# Patient Record
Sex: Female | Born: 2000 | Race: Black or African American | Hispanic: No | Marital: Single | Smoking: Never smoker
Health system: Southern US, Community
[De-identification: ages and names within clinical notes are randomized; demographics above are authoritative.]

---
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
## 2019-01-19 ENCOUNTER — Ambulatory Visit: Payer: Self-pay | Attending: Internal Medicine

## 2019-01-19 DIAGNOSIS — Z20822 Contact with and (suspected) exposure to covid-19: Secondary | ICD-10-CM

## 2019-01-21 LAB — NOVEL CORONAVIRUS, NAA: SARS-CoV-2, NAA: NOT DETECTED
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
## 2019-03-22 ENCOUNTER — Ambulatory Visit: Payer: Self-pay | Attending: Internal Medicine

## 2019-03-22 DIAGNOSIS — Z20822 Contact with and (suspected) exposure to covid-19: Secondary | ICD-10-CM

## 2019-03-23 LAB — NOVEL CORONAVIRUS, NAA: SARS-CoV-2, NAA: NOT DETECTED

## 2019-12-25 ENCOUNTER — Ambulatory Visit: Payer: Self-pay

## 2020-02-21 ENCOUNTER — Other Ambulatory Visit (HOSPITAL_COMMUNITY)
Admission: RE | Admit: 2020-02-21 | Discharge: 2020-02-21 | Disposition: A | Discharge status: Home / Self Care | Payer: Self-pay | Source: Ambulatory Visit | Attending: Student in an Organized Health Care Education/Training Program | Admitting: Student in an Organized Health Care Education/Training Program

## 2020-02-21 ENCOUNTER — Ambulatory Visit (INDEPENDENT_AMBULATORY_CARE_PROVIDER_SITE_OTHER): Payer: Self-pay | Admitting: Internal Medicine

## 2020-02-21 ENCOUNTER — Other Ambulatory Visit: Payer: Self-pay

## 2020-02-21 ENCOUNTER — Encounter: Payer: Self-pay | Admitting: Internal Medicine

## 2020-02-21 VITALS — BP 127/81 | HR 70 | Temp 98.1°F | Ht 70.0 in | Wt 217.0 lb

## 2020-02-21 DIAGNOSIS — Z113 Encounter for screening for infections with a predominantly sexual mode of transmission: Secondary | ICD-10-CM

## 2020-02-21 DIAGNOSIS — F330 Major depressive disorder, recurrent, mild: Secondary | ICD-10-CM

## 2020-02-21 DIAGNOSIS — Z0000 Encounter for general adult medical examination without abnormal findings: Secondary | ICD-10-CM

## 2020-02-21 DIAGNOSIS — G43909 Migraine, unspecified, not intractable, without status migrainosus: Secondary | ICD-10-CM

## 2020-02-21 DIAGNOSIS — A749 Chlamydial infection, unspecified: Secondary | ICD-10-CM

## 2020-02-21 DIAGNOSIS — A5602 Chlamydial vulvovaginitis: Secondary | ICD-10-CM

## 2020-02-21 HISTORY — DX: Migraine, unspecified, not intractable, without status migrainosus: G43.909

## 2020-02-21 NOTE — Assessment & Plan Note (Signed)
Krista Torres endorses a history of previous episodes of depression that she usually waited out until they resolve.  She notes starting around November 2021, she noted her mood started to become depressed and she no longer enjoyed the things she used to as much.  She was struggling with school as finals were going on.  During this time, her dog passed away and she broke up with her boyfriend; she feels these events severely exacerbated her already present depressed state.  She endorses feeling down, not enjoying things she used to such as going out with her friends.  She endorses a lack of appetite, difficulty relaxing and sleeping.  She feels like her sleeping is affected by her being switched from second shift to third shift with her job.  Assessment/plan: PHQ-9 of 18 today.  I discussed with Krista Torres that I am worried that she is suffering from depression and she agrees.  At this time, she is reluctant to start any medication, but is very interested in going to therapy.  -Referral to [facility] -Follow-up in 4 to 6 weeks to follow-up -If worsening in mood or PHQ-9 at that time, consider initiation of SSRIs

## 2020-02-21 NOTE — Patient Instructions (Addendum)
It was nice seeing you today! Thank you for choosing [facility] for your Primary Care.    Today we talked about:   1. STD screening: We will check for Gonorrhea and Chlamydia. We usually also check HIV and Syphilis, but that is a blood test. If you decide you want to do it, call the clinic and we can schedule a lab only visit.   2. Birth Control: Your Nexplanon is good till December 2023.   3. Lets follow up in 4-6 weeks to see how your Depression is.
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
## 2020-02-21 NOTE — Progress Notes (Addendum)
CC: Wellness visit, establish care  HPI:  Krista Torres is a 20 y.o. with a PMHx as listed below who presents to the clinic for wellness visit, establish care.   Yalonda denies any acute complaints today.  She would like to be tested for STDs and have any other age-appropriate screening test that she requires.  Please see the Encounters tab for problem-based Assessment & Plan regarding status of patient's acute and chronic conditions.  Past Medical History:  -Dysmenorrhea -Migraines -Childhood asthma  Past Surgical History:  None  Past Family History:  Maternal grandmother-leukemia Cousin-syncope with emergent cardiac surgery afterwards, is unsure of her diagnosis Grandparents-diabetes  Social History:  -Lives alone with her dog -Denies any tobacco, alcohol or drug use Chief Operating Officer currently on sabbatical, working third shift at [employer]: Review of Systems  Constitutional: Positive for weight loss (Due to lack of appetite). Negative for chills, fever and malaise/fatigue.  Eyes: Negative for blurred vision and double vision.  Respiratory: Negative for cough, shortness of breath and wheezing.   Cardiovascular: Negative for chest pain, palpitations and leg swelling.  Gastrointestinal: Negative for abdominal pain, blood in stool, constipation, diarrhea, heartburn, melena, nausea and vomiting.  Genitourinary: Negative for dysuria, frequency, hematuria and urgency.       Negative for vaginal discharge, vaginal pain  Musculoskeletal: Negative for joint pain and myalgias.  Skin: Negative for itching and rash.  Neurological: Negative for dizziness, focal weakness, weakness and headaches.  Psychiatric/Behavioral: Positive for depression. Negative for hallucinations, substance abuse and suicidal ideas.   Physical Exam:  Vitals:   02/21/20 0855 02/21/20 0942  BP: (!) 145/94 127/81  Pulse: 72 70  Temp: 98.1 F (36.7 C)   TempSrc: Oral    SpO2: 100%   Weight: 217 lb (98.4 kg)   Height: 5\' 10"  (1.778 m)    Physical Exam Vitals and nursing note reviewed.  Constitutional:      General: She is not in acute distress.    Appearance: She is normal weight.  HENT:     Head: Normocephalic and atraumatic.  Eyes:     Conjunctiva/sclera: Conjunctivae normal.     Pupils: Pupils are equal, round, and reactive to light.  Cardiovascular:     Rate and Rhythm: Normal rate and regular rhythm.     Heart sounds: No murmur heard. No gallop.   Pulmonary:     Effort: Pulmonary effort is normal. No respiratory distress.     Breath sounds: No wheezing, rhonchi or rales.  Abdominal:     General: Bowel sounds are normal. There is no distension.     Palpations: Abdomen is soft.     Tenderness: There is no abdominal tenderness. There is no guarding.  Musculoskeletal:        General: No signs of injury.     Right lower leg: No edema.     Left lower leg: No edema.  Skin:    General: Skin is warm and dry.  Neurological:     General: No focal deficit present.     Mental Status: She is alert and oriented to person, place, and time. Mental status is at baseline.  Psychiatric:        Mood and Affect: Mood normal.        Behavior: Behavior normal.        Thought Content: Thought content normal.        Judgment: Judgment normal.    Assessment & Plan:   See Encounters  Tab for problem based charting.  Patient discussed with Dr. Evette Doffing

## 2020-02-22 DIAGNOSIS — A749 Chlamydial infection, unspecified: Secondary | ICD-10-CM | POA: Insufficient documentation

## 2020-02-22 LAB — URINE CYTOLOGY ANCILLARY ONLY
Chlamydia: POSITIVE — AB
Comment: NEGATIVE
Comment: NEGATIVE
Comment: NORMAL
Neisseria Gonorrhea: NEGATIVE
Trichomonas: NEGATIVE

## 2020-02-22 MED ORDER — DOXYCYCLINE HYCLATE 100 MG PO CAPS: 100.0000 mg | ORAL_CAPSULE | Freq: Two times a day (BID) | ORAL | 0 refills | Status: DC

## 2020-02-22 NOTE — Assessment & Plan Note (Signed)
STD screening positive for Chlamydia. Discussed results with patient. She will inform her previous partner.   -Doxycycline 100 mg twice daily for 7 days sent to pharmacy

## 2020-02-22 NOTE — Addendum Note (Signed)
Addended by: Jose Persia on: 02/22/2020 01:01 PM   Modules accepted: Orders

## 2020-02-22 NOTE — Progress Notes (Signed)
Internal Medicine Clinic Attending  Case discussed with Dr. Basaraba  At the time of the visit.  We reviewed the resident's history and exam and pertinent patient test results.  I agree with the assessment, diagnosis, and plan of care documented in the resident's note.  

## 2020-02-27 ENCOUNTER — Encounter: Payer: Self-pay | Admitting: Internal Medicine

## 2020-03-12 ENCOUNTER — Other Ambulatory Visit: Payer: Self-pay

## 2020-03-12 ENCOUNTER — Telehealth: Payer: Self-pay | Admitting: Behavioral Health

## 2020-03-12 ENCOUNTER — Institutional Professional Consult (permissible substitution): Payer: Self-pay | Admitting: Behavioral Health

## 2020-03-12 NOTE — Telephone Encounter (Signed)
Contacted Pt three times to initiate visit. Lft msg for Pt to call & r/s since this appt time was not working.  Dr. Theodis Shove

## 2020-04-10 NOTE — Addendum Note (Signed)
Addended by: Hulan Fray on: 04/10/2020 12:53 PM   Modules accepted: Orders

## 2020-05-22 ENCOUNTER — Encounter: Payer: Self-pay | Admitting: Internal Medicine

## 2020-05-22 NOTE — Progress Notes (Deleted)
   CC: ***  HPI:  [name] is a 20 y.o.   Past Medical History:  Diagnosis Date  . Acanthosis nigricans 09/22/2011  . Migraine 02/21/2020   Review of Systems:  ***  Physical Exam:  There were no vitals filed for this visit. ***  Assessment & Plan:   See Encounters Tab for problem based charting.  Patient {GC/GE:[id number]::"discussed with","seen with"} Dr. {NAMES:[id number]::"Butcher","Guilloud","Hoffman","Mullen","Narendra","Raines","Vincent"}

## 2020-07-15 ENCOUNTER — Encounter: Payer: Self-pay | Admitting: *Deleted

## 2020-08-26 ENCOUNTER — Other Ambulatory Visit: Payer: Self-pay

## 2020-08-26 ENCOUNTER — Other Ambulatory Visit (HOSPITAL_COMMUNITY)
Admission: RE | Admit: 2020-08-26 | Discharge: 2020-08-26 | Disposition: A | Discharge status: Home / Self Care | Source: Ambulatory Visit | Attending: Student in an Organized Health Care Education/Training Program | Admitting: Student in an Organized Health Care Education/Training Program

## 2020-08-26 ENCOUNTER — Ambulatory Visit (INDEPENDENT_AMBULATORY_CARE_PROVIDER_SITE_OTHER): Admitting: Internal Medicine

## 2020-08-26 ENCOUNTER — Encounter: Payer: Self-pay | Admitting: Internal Medicine

## 2020-08-26 VITALS — BP 139/74 | HR 68 | Temp 98.7°F | Ht 70.0 in | Wt 213.1 lb

## 2020-08-26 DIAGNOSIS — B9689 Other specified bacterial agents as the cause of diseases classified elsewhere: Secondary | ICD-10-CM | POA: Diagnosis not present

## 2020-08-26 DIAGNOSIS — Z113 Encounter for screening for infections with a predominantly sexual mode of transmission: Secondary | ICD-10-CM | POA: Insufficient documentation

## 2020-08-26 DIAGNOSIS — N946 Dysmenorrhea, unspecified: Secondary | ICD-10-CM

## 2020-08-26 DIAGNOSIS — N760 Acute vaginitis: Secondary | ICD-10-CM | POA: Diagnosis not present

## 2020-08-26 NOTE — Assessment & Plan Note (Addendum)
Reports that she tested positive for gonorrhea twice in the past and was treated.  She states that she is currently sexually active with a new partner and would like to be tested for sexually transmitted infections again.  Denies any abnormal vaginal discharge, pain or odor.  -Follow-up cervicovaginal swab -Follow-up HIV and hep C screening   ADDENDUM: + BV, patient is asymptomatic at this time so do not recommend treatment. HIV and Hep C negative

## 2020-08-26 NOTE — Progress Notes (Signed)
   CC: Abnormal periods  HPI:  [name] is a 20 y.o. with a past medical history of dysmenorrhea presenting for evaluation of abnormal periods. For details of today's visit and the status of his chronic medical issues please refer to the assessment and plan.   Past Medical History:  Diagnosis Date   Acanthosis nigricans 09/22/2011   Migraine 02/21/2020   Review of Systems: Negative except as per assessment and plan  Physical Exam:  Vitals:   08/26/20 1007  BP: (!) 143/87  Pulse: 86  Temp: 98.7 F (37.1 C)  TempSrc: Oral  SpO2: 98%  Weight: 213 lb 1.6 oz (96.7 kg)  Height: '5\' 10"'$  (1.778 m)    Physical Exam General: alert, appears stated age, in no acute distress HEENT: Normocephalic, atraumatic, EOM intact, conjunctiva normal CV: Regular rate and rhythm, no murmurs rubs or gallops Pulm: Clear to auscultation bilaterally, normal work of breathing Abdomen: Soft, nondistended, bowel sounds present, no tenderness to palpation MSK: No lower extremity edema Skin: Warm and dry Neuro: Alert and oriented x3   Assessment & Plan:   See Encounters Tab for problem based charting.  Patient discussed with Dr. Jimmye Norman

## 2020-08-26 NOTE — Patient Instructions (Signed)
Please follow-up with your gynecologist regarding your Nexplanon.  Looks like it is due for exchange on 12/12/2021.  I will call you with the results of your vaginal swab and blood tests.

## 2020-08-26 NOTE — Assessment & Plan Note (Signed)
Patient reports a significant history of dysmenorrhea.  She was previously on 2 different oral contraceptives prior to having a Nexplanon placed in December 2020.  She states she used to have significant pelvic pain cramping, nausea, vomiting, migraines and heavy bleeding.  Initially this was improved on oral contraceptives however her migraines worsened on the second oral contraceptive she tried.  She has been doing really well since the Nexplanon was placed however 3 months ago noticed her period started recurring about every other week.  States they last about 3 days and are heavy in nature.  She denies any of the symptoms that she used to have like the severe cramping, pelvic pain and migraines.  States her symptoms have still been well controlled however she is having more frequent periods.  She states that she saw her gynecologist earlier this year but was not due for any exchange in her Nexplanon.  Per chart review it looks like she is due for that to be changed on 12/12/2021.  Discussed that she should follow-up with her gynecologist to see if this needs to be changed sooner or other adjustments due to her change in period frequency.

## 2020-08-27 LAB — CERVICOVAGINAL ANCILLARY ONLY
Bacterial Vaginitis (gardnerella): POSITIVE — AB
Candida Glabrata: NEGATIVE
Candida Vaginitis: NEGATIVE
Chlamydia: NEGATIVE
Comment: NEGATIVE
Comment: NEGATIVE
Comment: NEGATIVE
Comment: NEGATIVE
Comment: NEGATIVE
Comment: NORMAL
Neisseria Gonorrhea: NEGATIVE
Trichomonas: NEGATIVE

## 2020-08-27 LAB — HEPATITIS C ANTIBODY: Hep C Virus Ab: <0.1 s/co ratio (ref 0.0–0.9)

## 2020-08-27 LAB — HIV ANTIBODY (ROUTINE TESTING W REFLEX): HIV Screen 4th Generation wRfx: NONREACTIVE

## 2020-08-27 MED ORDER — METRONIDAZOLE 500 MG PO TABS: 500.0000 mg | ORAL_TABLET | Freq: Two times a day (BID) | ORAL | 0 refills | Status: AC

## 2020-08-27 NOTE — Addendum Note (Signed)
Addended by: Mike Craze on: 08/27/2020 01:25 PM   Modules accepted: Orders

## 2020-08-28 NOTE — Addendum Note (Signed)
Addended by: Mike Craze on: 08/28/2020 09:14 AM   Modules accepted: Orders

## 2020-09-09 NOTE — Progress Notes (Signed)
Internal Medicine Clinic Attending ° °Case discussed with Dr. Rehman  At the time of the visit.  We reviewed the resident’s history and exam and pertinent patient test results.  I agree with the assessment, diagnosis, and plan of care documented in the resident’s note.  ° °

## 2020-12-24 ENCOUNTER — Emergency Department (HOSPITAL_BASED_OUTPATIENT_CLINIC_OR_DEPARTMENT_OTHER)
Admission: EM | Admit: 2020-12-24 | Discharge: 2020-12-24 | Disposition: A | Discharge status: Home / Self Care | Attending: Emergency Medicine | Admitting: Emergency Medicine

## 2020-12-24 ENCOUNTER — Encounter (HOSPITAL_BASED_OUTPATIENT_CLINIC_OR_DEPARTMENT_OTHER): Payer: Self-pay | Admitting: Emergency Medicine

## 2020-12-24 ENCOUNTER — Other Ambulatory Visit: Payer: Self-pay

## 2020-12-24 DIAGNOSIS — D239 Other benign neoplasm of skin, unspecified: Secondary | ICD-10-CM | POA: Insufficient documentation

## 2020-12-24 DIAGNOSIS — L989 Disorder of the skin and subcutaneous tissue, unspecified: Secondary | ICD-10-CM | POA: Insufficient documentation

## 2020-12-24 NOTE — ED Triage Notes (Signed)
Pt reports sharp pain in the back of her head onset today. Reports feeling a "lump" to back of head. Sore to the touch.

## 2020-12-24 NOTE — ED Provider Notes (Signed)
[facility] DEPT Provider Note   CSN: [id number] Arrival date & time: 12/24/20  1956     History Chief Complaint  Patient presents with   Headache    Krista Torres is a 20 y.o. female.  Patient presents to ER chief complaint of feeling a bump on the back of her head on the right side.  She noticed it today.  She states is sore to touch.  Denies any other trauma or injury that she can recall.  No reports of fevers or cough or vomiting or diarrhea.      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 No pertinent surgical history.   OB History   No obstetric history on file.     History reviewed. No pertinent family history.  Social History   Tobacco Use   Smoking status: Never   Smokeless tobacco: Never    Home Medications Prior to Admission medications   Not on File    Allergies    Patient has no known allergies.  Review of Systems   Review of Systems  Constitutional:  Negative for fever.  HENT:  Negative for ear pain.   Eyes:  Negative for pain.  Respiratory:  Negative for cough.   Cardiovascular:  Negative for chest pain.  Gastrointestinal:  Negative for abdominal pain.  Genitourinary:  Negative for flank pain.  Musculoskeletal:  Negative for back pain.  Skin:  Negative for rash.  Neurological:  Negative for dizziness.   Physical Exam Updated Vital Signs BP (!) 166/98 (BP Location: Right Arm)    Pulse 70    Temp 98.5 F (36.9 C)    Resp 17    Ht 5\' 10"  (1.778 m)    Wt 99.8 kg    LMP  (LMP Unknown)    SpO2 99%    BMI 31.57 kg/m   Physical Exam Constitutional:      General: She is not in acute  distress.    Appearance: Normal appearance.  HENT:     Head: Normocephalic.     Nose: Nose normal.  Eyes:     Extraocular Movements: Extraocular movements intact.  Cardiovascular:     Rate and Rhythm: Normal rate.  Pulmonary:     Effort: Pulmonary effort is normal.  Musculoskeletal:        General: Normal range of motion.     Cervical back: Normal range of motion.  Skin:    Comments: Right-sided occipital scalp lesion present approximately 1 x 1 cm in size days no discoloration noted no cellulitis noted.  Tender to palpation.  No underlying fluctuance noted.  Suspect possible developing acne/other mild skin lesion.   Neurological:     General: No focal deficit present.     Mental Status: She is alert. Mental status is at baseline.    ED Results / Procedures / Treatments   Labs (all labs ordered are listed, but only abnormal results are displayed) Labs Reviewed - No data to display  EKG None  Radiology No results found.  Procedures Procedures   Medications Ordered in ED Medications - No data to display  ED Course  I have reviewed the triage vital signs and  the nursing notes.  Pertinent labs & imaging results that were available during my care of the patient were reviewed by me and considered in my medical decision making (see chart for details).    MDM Rules/Calculators/A&P                           Suspect acne or other skin lesion noted in the right occipital scalp region.  No surrounding cellulitis noted some size a small approximately 1 x 1 cm.  On palpation more consistent with induration/bogginess, no fluctuance noted.  Recommending daily washing with soap and water and close follow-up with primary care doctor within the week.  Advised return if there is increasing size increased pain fevers or any additional concerns.  Final Clinical Impression(s) / ED Diagnoses Final diagnoses:  Scalp lesion    Rx / DC Orders ED Discharge Orders     None         Luna Fuse, MD 12/24/20 2212

## 2020-12-24 NOTE — ED Notes (Signed)
ED Provider at bedside. 

## 2020-12-24 NOTE — Discharge Instructions (Addendum)
Wash the area gently with soap and water daily.  I suspect he will improve in 1 to 2 weeks.  However if you feel it is worsening in size, increased redness increased pain or fevers return to the ER for repeat evaluation.

## 2021-01-28 ENCOUNTER — Ambulatory Visit (INDEPENDENT_AMBULATORY_CARE_PROVIDER_SITE_OTHER): Payer: Self-pay | Admitting: Internal Medicine

## 2021-01-28 ENCOUNTER — Encounter: Payer: Self-pay | Admitting: Internal Medicine

## 2021-01-28 VITALS — BP 143/98 | HR 84 | Temp 98.3°F | Ht 70.0 in | Wt 233.7 lb

## 2021-01-28 DIAGNOSIS — N946 Dysmenorrhea, unspecified: Secondary | ICD-10-CM

## 2021-01-28 DIAGNOSIS — R030 Elevated blood-pressure reading, without diagnosis of hypertension: Secondary | ICD-10-CM

## 2021-01-28 NOTE — Progress Notes (Deleted)
° °  CC: ***  HPI:  Krista Torres is a 21 y.o.   Past Medical History:  Diagnosis Date   Acanthosis nigricans 09/22/2011   Migraine 02/21/2020   Review of Systems:  ***  Physical Exam:  There were no vitals filed for this visit. ***  Assessment & Plan:   See Encounters Tab for problem based charting.  Patient {GC/GE:[id number]::"discussed with","seen with"} Dr. {NAMES:[id number]::"Guilloud","Hoffman","Mullen","Narendra","Williams","Vincent"}

## 2021-01-28 NOTE — Progress Notes (Signed)
° °  CC: birth control  HPI:  [name] is a 21 y.o. with a past medical history listed below presenting for birth control. For details of today's visit and the status of his chronic medical issues please refer to the assessment and plan.   Past Medical History:  Diagnosis Date   Acanthosis nigricans 09/22/2011   Migraine 02/21/2020   Review of Systems:   Review of Systems  Constitutional:  Positive for malaise/fatigue.  Gastrointestinal:  Positive for abdominal pain and nausea. Negative for constipation and diarrhea.  Neurological:  Positive for headaches.  Psychiatric/Behavioral:  The patient does not have insomnia.     Physical Exam:  Vitals:   01/28/21 1011 01/28/21 1027  BP: (!) 144/94 (!) 143/98  Pulse: 85 84  Temp: 98.3 F (36.8 C)   TempSrc: Oral   SpO2: 99%   Weight: 233 lb 11.2 oz (106 kg)   Height: 5\' 10"  (1.778 m)     Physical Exam General: alert, appears stated age, in no acute distress HEENT: Normocephalic, atraumatic, EOM intact, conjunctiva normal CV: Regular rate and rhythm, no murmurs rubs or gallops Pulm: Clear to auscultation bilaterally, normal work of breathing Abdomen: Soft, nondistended, bowel sounds present, no tenderness to palpation MSK: No lower extremity edema Skin: Warm and dry Neuro: Alert and oriented x3   Assessment & Plan:   See Encounters Tab for problem based charting.  Patient discussed with Dr. Jimmye Norman

## 2021-01-28 NOTE — Assessment & Plan Note (Signed)
Vitals:   01/28/21 1011 01/28/21 1027  BP: (!) 144/94 (!) 143/98   Patient's blood pressure readings are elevated today.  Have been elevated in the past as well.  Will recheck at next visit and consider treatment versus work-up for primary aldosteronism.

## 2021-01-28 NOTE — Patient Instructions (Addendum)
It was a pleasure seeing you today!  I have ordered some blood work to check your blood counts and iron, we will call you with the results if they are abnormal.  I have also sent in an urgent referral to gynecology.  If you do not hear from anybody this week or early next week please let me know.

## 2021-01-28 NOTE — Assessment & Plan Note (Signed)
Patient presents today for evaluation of dysmenorrhea.  She is interested in having her Nexplanon device removed.  She states since I saw her last her symptoms gradually improved and she did not follow-up with gynecology.  She states for the past 3 to 4 months she has had progressive worsening of her symptoms which include fatigue, nausea, severe abdominal cramping and heavy bleeding again.  She states that normally her periods are shorter last about 3 days of heavy bleeding but now are about a week and a half long.  Her symptoms have caused her to miss work as well as church.  She does not feel like the Nexplanon is helping control her symptoms as it was before.  Discussed that unfortunately we cannot remove her Nexplanon here and I would like her to follow-up with gynecology given the severity of her symptoms and difficulty finding a good oral contraceptive in the past.    -Referral to gynecology, patient does prefer a female African-American physician if possible - Follow-up CBC and iron studies -Recommended oral iron supplementation at least 3 days a week

## 2021-01-30 LAB — CBC WITH DIFFERENTIAL/PLATELET
Basophils Absolute: 0 10*3/uL (ref 0.0–0.2)
Basos: 1 %
EOS (ABSOLUTE): 0 10*3/uL (ref 0.0–0.4)
Eos: 1 %
Hematocrit: 44.5 % (ref 34.0–46.6)
Hemoglobin: 15.2 g/dL (ref 11.1–15.9)
Immature Grans (Abs): 0 10*3/uL (ref 0.0–0.1)
Immature Granulocytes: 0 %
Lymphocytes Absolute: 1.4 10*3/uL (ref 0.7–3.1)
Lymphs: 32 %
MCH: 29.2 pg (ref 26.6–33.0)
MCHC: 34.2 g/dL (ref 31.5–35.7)
MCV: 85 fL (ref 79–97)
Monocytes Absolute: 0.4 10*3/uL (ref 0.1–0.9)
Monocytes: 8 %
Neutrophils Absolute: 2.5 10*3/uL (ref 1.4–7.0)
Neutrophils: 58 %
Platelets: 268 10*3/uL (ref 150–450)
RBC: 5.21 x10E6/uL (ref 3.77–5.28)
RDW: 12.8 % (ref 11.7–15.4)
WBC: 4.2 10*3/uL (ref 3.4–10.8)

## 2021-01-30 LAB — IRON,TIBC AND FERRITIN PANEL
Ferritin: 38 ng/mL (ref 15–150)
Iron Saturation: 25 % (ref 15–55)
Iron: 93 ug/dL (ref 27–159)
Total Iron Binding Capacity: 370 ug/dL (ref 250–450)
UIBC: 277 ug/dL (ref 131–425)

## 2021-02-04 NOTE — Progress Notes (Signed)
Internal Medicine Clinic Attending ° °Case discussed with Dr. Rehman  At the time of the visit.  We reviewed the resident’s history and exam and pertinent patient test results.  I agree with the assessment, diagnosis, and plan of care documented in the resident’s note.  ° °

## 2021-02-16 ENCOUNTER — Telehealth: Payer: Self-pay | Admitting: Internal Medicine

## 2021-02-16 NOTE — Telephone Encounter (Signed)
Pt requesting a call back.  Pt states she sent an e-mail to Dr. Laural Golden  about her  menstrual cycle and a disability letter.

## 2021-04-08 ENCOUNTER — Encounter: Payer: Self-pay | Admitting: Medical

## 2022-09-01 ENCOUNTER — Other Ambulatory Visit: Payer: Self-pay

## 2022-09-01 ENCOUNTER — Emergency Department (HOSPITAL_BASED_OUTPATIENT_CLINIC_OR_DEPARTMENT_OTHER)
Admission: EM | Admit: 2022-09-01 | Discharge: 2022-09-01 | Disposition: A | Discharge status: Home / Self Care | Payer: No Typology Code available for payment source | Attending: Emergency Medicine | Admitting: Emergency Medicine

## 2022-09-01 ENCOUNTER — Encounter (HOSPITAL_BASED_OUTPATIENT_CLINIC_OR_DEPARTMENT_OTHER): Payer: Self-pay

## 2022-09-01 DIAGNOSIS — S8000XA Contusion of unspecified knee, initial encounter: Secondary | ICD-10-CM | POA: Diagnosis not present

## 2022-09-01 DIAGNOSIS — Y92410 Unspecified street and highway as the place of occurrence of the external cause: Secondary | ICD-10-CM | POA: Diagnosis not present

## 2022-09-01 DIAGNOSIS — S39012A Strain of muscle, fascia and tendon of lower back, initial encounter: Secondary | ICD-10-CM | POA: Diagnosis not present

## 2022-09-01 DIAGNOSIS — S3992XA Unspecified injury of lower back, initial encounter: Secondary | ICD-10-CM | POA: Diagnosis present

## 2022-09-01 MED ORDER — METHOCARBAMOL 750 MG PO TABS: 750.0000 mg | ORAL_TABLET | Freq: Three times a day (TID) | ORAL | 0 refills | Status: DC | PRN

## 2022-09-01 MED ORDER — NAPROXEN 500 MG PO TABS: 500.0000 mg | ORAL_TABLET | Freq: Two times a day (BID) | ORAL | 0 refills | Status: DC
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
## 2022-09-29 ENCOUNTER — Other Ambulatory Visit: Payer: Self-pay

## 2022-09-29 ENCOUNTER — Emergency Department (HOSPITAL_BASED_OUTPATIENT_CLINIC_OR_DEPARTMENT_OTHER)
Admission: EM | Admit: 2022-09-29 | Discharge: 2022-09-29 | Disposition: A | Discharge status: Home / Self Care | Payer: Self-pay | Attending: Emergency Medicine | Admitting: Emergency Medicine

## 2022-09-29 ENCOUNTER — Encounter (HOSPITAL_BASED_OUTPATIENT_CLINIC_OR_DEPARTMENT_OTHER): Payer: Self-pay | Admitting: Emergency Medicine

## 2022-09-29 DIAGNOSIS — N898 Other specified noninflammatory disorders of vagina: Secondary | ICD-10-CM | POA: Insufficient documentation

## 2022-09-29 LAB — URINALYSIS, ROUTINE W REFLEX MICROSCOPIC
Bilirubin Urine: NEGATIVE
Glucose, UA: NEGATIVE mg/dL
Hgb urine dipstick: NEGATIVE
Ketones, ur: NEGATIVE mg/dL
Nitrite: POSITIVE — AB
Protein, ur: NEGATIVE mg/dL
Specific Gravity, Urine: 1.018 (ref 1.005–1.030)
pH: 6.5 (ref 5.0–8.0)

## 2022-09-29 LAB — PREGNANCY, URINE: Preg Test, Ur: NEGATIVE

## 2022-09-29 LAB — WET PREP, GENITAL
Sperm: NONE SEEN
Trich, Wet Prep: NONE SEEN
WBC, Wet Prep HPF POC: <10 (ref <10)
Yeast Wet Prep HPF POC: NONE SEEN

## 2022-09-29 MED ORDER — CEFTRIAXONE SODIUM 500 MG IJ SOLR
500.0000 mg | Freq: Once | INTRAMUSCULAR | Status: AC
  Administered 2022-09-29: 500 mg via INTRAMUSCULAR
  Filled 2022-09-29: qty 500

## 2022-09-29 MED ORDER — DOXYCYCLINE HYCLATE 100 MG PO TABS
100.0000 mg | ORAL_TABLET | Freq: Once | ORAL | Status: AC
  Administered 2022-09-29: 100 mg via ORAL
  Filled 2022-09-29: qty 1

## 2022-09-29 MED ORDER — DOXYCYCLINE HYCLATE 100 MG PO CAPS: 100.0000 mg | ORAL_CAPSULE | Freq: Two times a day (BID) | ORAL | 0 refills | Status: DC

## 2022-09-29 MED ORDER — LIDOCAINE HCL (PF) 1 % IJ SOLN
INTRAMUSCULAR | Status: AC
  Filled 2022-09-29: qty 5

## 2022-09-29 MED ORDER — METRONIDAZOLE 500 MG PO TABS: 500.0000 mg | ORAL_TABLET | Freq: Two times a day (BID) | ORAL | 0 refills | Status: DC

## 2022-09-29 NOTE — ED Provider Notes (Signed)
[name] EMERGENCY DEPARTMENT AT [facility] Provider Note   CSN: [id number] Arrival date & time: 09/29/22  2026     History Chief Complaint  Patient presents with   Exposure to STD    HPI Krista Torres is a 22 y.o. female presenting for chief complaint of STI exposure.  States that her significant other recently tested positive for chlamydia she endorses increased vaginal discharge but denies any acute pelvic pain. She has declined testing for HIV syphilis or any other blood-borne agents states she will follow-up with the health department for these tests..   Patient's recorded medical, surgical, social, medication list and allergies were reviewed in the Snapshot window as part of the initial history.   Review of Systems   Review of Systems  Constitutional:  Negative for chills and fever.  HENT:  Negative for ear pain and sore throat.   Eyes:  Negative for pain and visual disturbance.  Respiratory:  Negative for cough and shortness of breath.   Cardiovascular:  Negative for chest pain and palpitations.  Gastrointestinal:  Negative for abdominal pain and vomiting.  Genitourinary:  Positive for vaginal discharge. Negative for dysuria and hematuria.  Musculoskeletal:  Negative for arthralgias and back pain.  Skin:  Negative for color change and rash.  Neurological:  Negative for seizures and syncope.  All other systems reviewed and are negative.   Physical Exam Updated Vital Signs BP (!) 166/98   Pulse (!) 103   Temp 98.4 F (36.9 C) (Oral)   Resp 18   Wt 113.4 kg   LMP 09/10/2022 (Exact Date)   SpO2 99%   BMI 36.92 kg/m  Physical Exam Vitals and nursing note reviewed.  Constitutional:      General: She is not in acute distress.    Appearance: She is well-developed.  HENT:     Head: Normocephalic and atraumatic.  Eyes:     Conjunctiva/sclera: Conjunctivae normal.  Cardiovascular:     Rate and Rhythm: Normal rate and regular rhythm.      Heart sounds: No murmur heard. Pulmonary:     Effort: Pulmonary effort is normal. No respiratory distress.     Breath sounds: Normal breath sounds.  Abdominal:     General: There is no distension.     Palpations: Abdomen is soft.     Tenderness: There is no abdominal tenderness. There is no right CVA tenderness or left CVA tenderness.  Genitourinary:    Comments: Patient declined. Musculoskeletal:        General: No swelling or tenderness. Normal range of motion.     Cervical back: Neck supple.  Skin:    General: Skin is warm and dry.  Neurological:     General: No focal deficit present.     Mental Status: She is alert and oriented to person, place, and time. Mental status is at baseline.     Cranial Nerves: No cranial nerve deficit.      ED Course/ Medical Decision Making/ A&P    Procedures Procedures   Medications Ordered in ED Medications  cefTRIAXone (ROCEPHIN) injection 500 mg (has no administration in time range)  doxycycline (VIBRA-TABS) tablet 100 mg (has no administration in time range)    Medical Decision Making:    Krista Torres is a 22 y.o. female who presented to the ED today with STI exposure detailed above.    Patient with concern for confirmed STI exposure.  Will treat with ceftriaxone and doxycycline for gonorrhea chlamydia coverage.  Discussed  testing for HIV or syphilis and patient declined.  She will follow-up with health department for ongoing care and management outpatient setting.  Empiric treatment started pending results of swabs.  Tested positive for clue cells given ongoing discharge will also treat for BV with BV.  Normal menstrual cycles.  Denies concern for pregnancy Clinical Impression:  1. Vaginal discharge      Discharge   Final Clinical Impression(s) / ED Diagnoses Final diagnoses:  Vaginal discharge    Rx / DC Orders ED Discharge Orders          Ordered    metroNIDAZOLE (FLAGYL) 500 MG tablet  2 times daily         09/29/22 2307    doxycycline (VIBRAMYCIN) 100 MG capsule  2 times daily        09/29/22 2307              Glyn Ade, MD 09/29/22 2308

## 2022-09-29 NOTE — ED Triage Notes (Addendum)
Pt in with request for STD testing. Pt states her partner recently found out he had chlamydia. States she notices an increase in vaginal discharge and urinary frequency

## 2022-10-01 LAB — GC/CHLAMYDIA PROBE AMP (~~LOC~~) NOT AT ARMC
Chlamydia: NEGATIVE
Comment: NEGATIVE
Comment: NORMAL
Neisseria Gonorrhea: NEGATIVE

## 2023-02-23 ENCOUNTER — Encounter: Payer: Self-pay | Admitting: Nurse Practitioner

## 2023-02-23 ENCOUNTER — Ambulatory Visit: Payer: BC Managed Care – PPO | Admitting: Nurse Practitioner

## 2023-02-23 VITALS — BP 150/86 | HR 68 | Temp 97.9°F | Wt 260.2 lb

## 2023-02-23 DIAGNOSIS — Z1329 Encounter for screening for other suspected endocrine disorder: Secondary | ICD-10-CM | POA: Diagnosis not present

## 2023-02-23 DIAGNOSIS — Z3009 Encounter for other general counseling and advice on contraception: Secondary | ICD-10-CM | POA: Diagnosis not present

## 2023-02-23 DIAGNOSIS — Z13220 Encounter for screening for lipoid disorders: Secondary | ICD-10-CM

## 2023-02-23 DIAGNOSIS — Z0000 Encounter for general adult medical examination without abnormal findings: Secondary | ICD-10-CM

## 2023-02-23 DIAGNOSIS — Z113 Encounter for screening for infections with a predominantly sexual mode of transmission: Secondary | ICD-10-CM

## 2023-02-23 NOTE — Progress Notes (Signed)
Subjective   Patient ID: Krista Torres, female    DOB: 05/11/2000, 23 y.o.   MRN: [id number]  Chief Complaint  Patient presents with   Annual Exam    Referring provider: Laretta Bolster, MD  Krista Torres is a 23 y.o. female with Past Medical History: 09/22/2011: Acanthosis nigricans 02/21/2020: Migraine   HPI  Patient presents today for a physical.  She is requesting a referral to OB/GYN for birth control counseling.  We will check labs today.  Patient is interested in STD screening.  Blood pressure was elevated in the office today.  We will have patient return for a nurse visit to recheck blood pressure.  Denies f/c/s, n/v/d, hemoptysis, PND, leg swelling Denies chest pain or edema     No Known Allergies  Immunization History  Administered Date(s) Administered   HPV Quadrivalent 09/22/2010, 09/22/2011, 09/21/2012   Meningococcal B, OMV 05/02/2017   Meningococcal Conjugate 09/21/2012, 05/02/2017   PFIZER(Purple Top)SARS-COV-2 Vaccination 04/27/2019   Tdap 09/22/2010    Tobacco History: Social History   Tobacco Use  Smoking Status Never  Smokeless Tobacco Never   Counseling given: Not Answered   Outpatient Encounter Medications as of 02/23/2023  Medication Sig   doxycycline (VIBRAMYCIN) 100 MG capsule Take 1 capsule (100 mg total) by mouth 2 (two) times daily. (Patient not taking: Reported on 02/23/2023)   methocarbamol (ROBAXIN-750) 750 MG tablet Take 1 tablet (750 mg total) by mouth every 8 (eight) hours as needed for muscle spasms. (Patient not taking: Reported on 02/23/2023)   metroNIDAZOLE (FLAGYL) 500 MG tablet Take 1 tablet (500 mg total) by mouth 2 (two) times daily. (Patient not taking: Reported on 02/23/2023)   naproxen (NAPROSYN) 500 MG tablet Take 1 tablet (500 mg total) by mouth 2 (two) times daily. (Patient not taking: Reported on 02/23/2023)   No facility-administered encounter medications on file as of 02/23/2023.    Review  of Systems  Review of Systems  Constitutional: Negative.   HENT: Negative.    Cardiovascular: Negative.   Gastrointestinal: Negative.   Allergic/Immunologic: Negative.   Neurological: Negative.   Psychiatric/Behavioral: Negative.       Objective:   BP (!) 150/86   Pulse 68   Temp 97.9 F (36.6 C)   Wt 260 lb 3.2 oz (118 kg)   SpO2 100%   BMI 38.42 kg/m   Wt Readings from Last 5 Encounters:  02/23/23 260 lb 3.2 oz (118 kg)  09/29/22 250 lb (113.4 kg)  09/01/22 250 lb (113.4 kg)  01/28/21 233 lb 11.2 oz (106 kg)  12/24/20 220 lb (99.8 kg)     Physical Exam Vitals and nursing note reviewed.  Constitutional:      General: She is not in acute distress.    Appearance: She is well-developed.  Cardiovascular:     Rate and Rhythm: Normal rate and regular rhythm.  Pulmonary:     Effort: Pulmonary effort is normal.     Breath sounds: Normal breath sounds.  Neurological:     Mental Status: She is alert and oriented to person, place, and time.       Assessment & Plan:   Thyroid disorder screen -     TSH; Future  Lipid screening -     Lipid panel; Future  Routine adult health maintenance -     CBC; Future -     Comprehensive metabolic panel; Future  Birth control counseling -     Ambulatory referral to Obstetrics / Gynecology  Screen  for STD (sexually transmitted disease) -     RPR+HIV+GC+CT Panel; Future     No follow-ups on file.   Ivonne Andrew, NP 02/28/2023

## 2023-02-28 ENCOUNTER — Encounter: Payer: Self-pay | Admitting: Nurse Practitioner

## 2023-02-28 NOTE — Patient Instructions (Signed)
1. Thyroid disorder screen (Primary)  - TSH; Future  2. Lipid screening  - Lipid Panel; Future  3. Routine adult health maintenance  - CBC; Future - Comprehensive metabolic panel; Future  4. Birth control counseling  - Ambulatory referral to Obstetrics / Gynecology  5. Screen for STD (sexually transmitted disease)  - RPR+HIV+GC+CT Panel; Future

## 2023-03-01 ENCOUNTER — Other Ambulatory Visit: Payer: BC Managed Care – PPO

## 2023-03-01 ENCOUNTER — Ambulatory Visit: Payer: BC Managed Care – PPO

## 2023-03-01 ENCOUNTER — Other Ambulatory Visit: Payer: Self-pay | Admitting: Nurse Practitioner

## 2023-03-01 DIAGNOSIS — Z0000 Encounter for general adult medical examination without abnormal findings: Secondary | ICD-10-CM | POA: Diagnosis not present

## 2023-03-01 DIAGNOSIS — Z113 Encounter for screening for infections with a predominantly sexual mode of transmission: Secondary | ICD-10-CM | POA: Diagnosis not present

## 2023-03-01 DIAGNOSIS — Z1329 Encounter for screening for other suspected endocrine disorder: Secondary | ICD-10-CM | POA: Diagnosis not present

## 2023-03-01 DIAGNOSIS — Z13220 Encounter for screening for lipoid disorders: Secondary | ICD-10-CM

## 2023-03-03 LAB — CBC
Hematocrit: 46.4 % (ref 34.0–46.6)
Hemoglobin: 15.4 g/dL (ref 11.1–15.9)
MCH: 28.6 pg (ref 26.6–33.0)
MCHC: 33.2 g/dL (ref 31.5–35.7)
MCV: 86 fL (ref 79–97)
Platelets: 270 10*3/uL (ref 150–450)
RBC: 5.38 x10E6/uL — ABNORMAL HIGH (ref 3.77–5.28)
RDW: 13.5 % (ref 11.7–15.4)
WBC: 4.8 10*3/uL (ref 3.4–10.8)

## 2023-03-03 LAB — COMPREHENSIVE METABOLIC PANEL
ALT: 26 [IU]/L (ref 0–32)
AST: 22 [IU]/L (ref 0–40)
Albumin: 4.4 g/dL (ref 4.0–5.0)
Alkaline Phosphatase: 85 [IU]/L (ref 44–121)
BUN/Creatinine Ratio: 5 — ABNORMAL LOW (ref 9–23)
BUN: 5 mg/dL — ABNORMAL LOW (ref 6–20)
Bilirubin Total: 0.5 mg/dL (ref 0.0–1.2)
CO2: 23 mmol/L (ref 20–29)
Calcium: 9.5 mg/dL (ref 8.7–10.2)
Chloride: 101 mmol/L (ref 96–106)
Creatinine, Ser: 0.92 mg/dL (ref 0.57–1.00)
Globulin, Total: 2.9 g/dL (ref 1.5–4.5)
Glucose: 102 mg/dL — ABNORMAL HIGH (ref 70–99)
Potassium: 4.1 mmol/L (ref 3.5–5.2)
Sodium: 137 mmol/L (ref 134–144)
Total Protein: 7.3 g/dL (ref 6.0–8.5)
eGFR: 90 mL/min/{1.73_m2} (ref >59)

## 2023-03-03 LAB — RPR+HIV+GC+CT PANEL
HIV Screen 4th Generation wRfx: NONREACTIVE
RPR Ser Ql: NONREACTIVE

## 2023-03-03 LAB — LIPID PANEL
Chol/HDL Ratio: 4.4 {ratio} (ref 0.0–4.4)
Cholesterol, Total: 184 mg/dL (ref 100–199)
HDL: 42 mg/dL (ref >39)
LDL Chol Calc (NIH): 126 mg/dL — ABNORMAL HIGH (ref 0–99)
Triglycerides: 88 mg/dL (ref 0–149)
VLDL Cholesterol Cal: 16 mg/dL (ref 5–40)

## 2023-03-03 LAB — TSH: TSH: 2.08 u[IU]/mL (ref 0.450–4.500)

## 2023-04-22 ENCOUNTER — Ambulatory Visit: Payer: BC Managed Care – PPO | Admitting: Obstetrics and Gynecology

## 2023-04-22 ENCOUNTER — Encounter: Payer: Self-pay | Admitting: Obstetrics and Gynecology

## 2023-04-22 VITALS — BP 145/103 | HR 80 | Ht 70.0 in | Wt 261.0 lb

## 2023-04-22 DIAGNOSIS — N946 Dysmenorrhea, unspecified: Secondary | ICD-10-CM

## 2023-04-22 DIAGNOSIS — R030 Elevated blood-pressure reading, without diagnosis of hypertension: Secondary | ICD-10-CM

## 2023-04-22 DIAGNOSIS — Z3046 Encounter for surveillance of implantable subdermal contraceptive: Secondary | ICD-10-CM

## 2023-04-22 DIAGNOSIS — Z3009 Encounter for other general counseling and advice on contraception: Secondary | ICD-10-CM | POA: Diagnosis not present

## 2023-04-22 MED ORDER — NORETHINDRONE 0.35 MG PO TABS: 1.0000 | ORAL_TABLET | Freq: Every day | ORAL | 11 refills | Status: AC

## 2023-04-22 NOTE — Progress Notes (Signed)
 NEW GYNECOLOGY PATIENT Patient name: Krista Torres MRN [id number]  Date of birth: 2000-08-03 Chief Complaint:   Contraception and New Patient (Initial Visit)     History:  Krista Torres is a 23 y.o. G0P0000 being seen today for nexplanon removal .  Nexplanon has been in place since 2020 and would like to switch to other contraceptive. Had worsened migraines. Was on a "single ingredient" pill but only had menses 4x a year aand did not like that it was so infrequent. No prior pap smear. Prior to nexplanon, reports having irregular cycles.   CHC contraindications: worsened migraine on CHC and uncontrolled hypertension      Gynecologic History Patient's last menstrual period was 03/15/2023. Contraception: Nexplanon - expired Last Pap: No results found for: "DIAGPAP", "HPVHIGH", "ADEQPAP" Last Mammogram: n/a Last Colonoscopy: n/a  Obstetric History OB History  Gravida Para Term Preterm AB Living  0 0 0 0 0 0  SAB IAB Ectopic Multiple Live Births  0 0 0 0 0    Past Medical History:  Diagnosis Date   Acanthosis nigricans 09/22/2011   Migraine 02/21/2020    History reviewed. No pertinent surgical history.  No current outpatient medications on file prior to visit.   No current facility-administered medications on file prior to visit.    No Known Allergies  Social History:  reports that she has never smoked. She has never used smokeless tobacco. She reports current alcohol use. She reports that she does not use drugs.  Family History  Problem Relation Age of Onset   Cancer Maternal Grandmother    Diabetes Maternal Grandfather     The following portions of the patient's history were reviewed and updated as appropriate: allergies, current medications, past family history, past medical history, past social history, past surgical history and problem list.  Review of Systems Pertinent items noted in HPI and remainder of comprehensive ROS otherwise  negative.  Physical Exam:  BP (!) 151/102   Pulse 79   Ht 5\' 10"  (1.778 m)   Wt 261 lb (118.4 kg)   LMP 03/15/2023   BMI 37.45 kg/m  Physical Exam Vitals and nursing note reviewed.  Constitutional:      Appearance: Normal appearance.  Cardiovascular:     Rate and Rhythm: Normal rate.  Pulmonary:     Effort: Pulmonary effort is normal.     Breath sounds: Normal breath sounds.  Neurological:     General: No focal deficit present.     Mental Status: She is alert and oriented to person, place, and time.  Psychiatric:        Mood and Affect: Mood normal.        Behavior: Behavior normal.        Thought Content: Thought content normal.        Judgment: Judgment normal.    NEXPLANON REMOVAL The risks (including infection, bleeding, pain, and uterine perforation) and benefits of the procedure were explained to the patient and Written informed consent was obtained.   Device was palpated in left upper arm. After time out, the skin was cleaned with alcohol and infiltrated with 1cc of 1% lidocaine with epinephrine was used to infiltrate the skin and subcutaneous tissue deep to the device. The area was cleaned with betadine x3.  Using an 11 blade, the skin was incised and the implant was removed intact.  The implant was shown to the patient.  The skin was cleaned, incision covered with Steri-Strips, and an adhesive bandage.  Arm  was wrapped and post procedure instructions provided to the patient.  POP chose as new contraceptive method.     Assessment and Plan:   1. Nexplanon removal (Primary) Now s/p uncomplicated nexplnon removal   2. Dysmenorrhea 3. Encounter for counseling regarding contraception Patient would like to return to pills, POP prescribed given history of worsened migraines on OCP and current elevated blood pressure.   - norethindrone (MICRONOR) 0.35 MG tablet; Take 1 tablet (0.35 mg total) by mouth daily.  Dispense: 28 tablet; Refill: 11   4. Elevated blood pressure  reading Recommend follow up with PCP regarding elevated blood pressure.    Routine preventative health maintenance measures emphasized. Please refer to After Visit Summary for other counseling recommendations.   Follow-up: No follow-ups on file.      Lorriane Shire, MD Obstetrician & Gynecologist, Faculty Practice [facility] [employer], [facility]

## 2023-04-22 NOTE — Patient Instructions (Addendum)
 Your NEXPLANON implant was just removed Here is some helpful information on what you can expect and how to care for your removal site. 24 Hours wear your top bandage The compression bandage helps minimize bruising.  3-5 Days keep your implant site covered While the insertion site is healing, keep the area covered with a smaller bandage.

## 2023-04-29 DIAGNOSIS — F4321 Adjustment disorder with depressed mood: Secondary | ICD-10-CM | POA: Diagnosis not present

## 2023-04-29 DIAGNOSIS — F411 Generalized anxiety disorder: Secondary | ICD-10-CM | POA: Diagnosis not present

## 2023-05-04 DIAGNOSIS — F411 Generalized anxiety disorder: Secondary | ICD-10-CM | POA: Diagnosis not present

## 2023-05-04 DIAGNOSIS — F4321 Adjustment disorder with depressed mood: Secondary | ICD-10-CM | POA: Diagnosis not present

## 2023-05-11 DIAGNOSIS — F411 Generalized anxiety disorder: Secondary | ICD-10-CM | POA: Diagnosis not present

## 2023-05-11 DIAGNOSIS — F4321 Adjustment disorder with depressed mood: Secondary | ICD-10-CM | POA: Diagnosis not present

## 2023-05-18 DIAGNOSIS — F4321 Adjustment disorder with depressed mood: Secondary | ICD-10-CM | POA: Diagnosis not present

## 2023-05-18 DIAGNOSIS — F411 Generalized anxiety disorder: Secondary | ICD-10-CM | POA: Diagnosis not present

## 2023-05-25 DIAGNOSIS — F411 Generalized anxiety disorder: Secondary | ICD-10-CM | POA: Diagnosis not present

## 2023-05-25 DIAGNOSIS — F4321 Adjustment disorder with depressed mood: Secondary | ICD-10-CM | POA: Diagnosis not present

## 2023-06-10 DIAGNOSIS — F411 Generalized anxiety disorder: Secondary | ICD-10-CM | POA: Diagnosis not present

## 2023-06-10 DIAGNOSIS — F4321 Adjustment disorder with depressed mood: Secondary | ICD-10-CM | POA: Diagnosis not present

## 2023-06-15 DIAGNOSIS — F4321 Adjustment disorder with depressed mood: Secondary | ICD-10-CM | POA: Diagnosis not present

## 2023-06-15 DIAGNOSIS — F411 Generalized anxiety disorder: Secondary | ICD-10-CM | POA: Diagnosis not present

## 2023-06-21 ENCOUNTER — Telehealth: Payer: Self-pay

## 2023-06-21 NOTE — Transitions of Care (Post Inpatient/ED Visit) (Signed)
   06/21/2023  Name: Krista Torres MRN: [id number] DOB: January 31, 2000  Today's TOC FU Call Status: Today's TOC FU Call Status:: Unsuccessful Call (1st Attempt) Unsuccessful Call (1st Attempt) Date: 06/21/23  Attempted to reach the patient regarding the most recent Inpatient/ED visit.  Follow Up Plan: Additional outreach attempts will be made to reach the patient to complete the Transitions of Care (Post Inpatient/ED visit) call.   Signature  [employer], [place]

## 2023-06-22 DIAGNOSIS — F411 Generalized anxiety disorder: Secondary | ICD-10-CM | POA: Diagnosis not present

## 2023-06-22 DIAGNOSIS — F4321 Adjustment disorder with depressed mood: Secondary | ICD-10-CM | POA: Diagnosis not present

## 2023-06-23 ENCOUNTER — Telehealth: Payer: Self-pay

## 2023-06-23 NOTE — Transitions of Care (Post Inpatient/ED Visit) (Signed)
   06/23/2023  Name: Krista Torres MRN: [id number] DOB: Sep 23, 2000  Today's TOC FU Call Status:   Patient's Name and Date of Birth confirmed.  Transition Care Management Follow-up Telephone Call Discharge Facility: Other (Non[facility]) Type of Discharge: Emergency Department Reason for ED Visit: Other: How have you been since you were released from the hospital?: Better Any questions or concerns?: No  Items Reviewed: Did you receive and understand the discharge instructions provided?: No Medications obtained,verified, and reconciled?: Yes (Medications Reviewed) Any new allergies since your discharge?: No Dietary orders reviewed?: No Do you have support at home?: Yes People in Home [RPT]: parent(s)  Medications Reviewed Today: Medications Reviewed Today     Reviewed by Angelita Bares, CMA (Certified Medical Assistant) on 06/23/23 at 1345  Med List Status: <None>   Medication Order Taking? Sig Documenting Provider Last Dose Status Informant  norethindrone  (MICRONOR ) 0.35 MG tablet [id number] Yes Take 1 tablet (0.35 mg total) by mouth daily. Ajewole, Christana, MD  Active             Home Care and Equipment/Supplies: Were Home Health Services Ordered?: No Any new equipment or medical supplies ordered?: No  Functional Questionnaire: Do you need assistance with bathing/showering or dressing?: No Do you need assistance with meal preparation?: No Do you need assistance with eating?: No Do you have difficulty maintaining continence: No Do you need assistance with getting out of bed/getting out of a chair/moving?: No Do you have difficulty managing or taking your medications?: No  Follow up appointments reviewed: Specialist Hospital Follow-up appointment confirmed?: NA Do you need transportation to your follow-up appointment?: No Do you understand care options if your condition(s) worsen?: Yes-patient verbalized understanding  SDOH Interventions Today     Flowsheet Row Most Recent Value  SDOH Interventions   Food Insecurity Interventions Intervention Not Indicated  Housing Interventions Intervention Not Indicated  Transportation Interventions Intervention Not Indicated    SIGNATURE Shaylin Blatt, RMA

## 2023-06-27 ENCOUNTER — Encounter: Payer: Self-pay | Admitting: Nurse Practitioner

## 2023-06-27 ENCOUNTER — Ambulatory Visit (INDEPENDENT_AMBULATORY_CARE_PROVIDER_SITE_OTHER): Admitting: Nurse Practitioner

## 2023-06-27 VITALS — BP 131/85 | HR 99 | Temp 97.6°F | Wt 255.0 lb

## 2023-06-27 DIAGNOSIS — U071 COVID-19: Secondary | ICD-10-CM | POA: Diagnosis not present

## 2023-06-27 DIAGNOSIS — Z09 Encounter for follow-up examination after completed treatment for conditions other than malignant neoplasm: Secondary | ICD-10-CM | POA: Diagnosis not present

## 2023-06-27 NOTE — Assessment & Plan Note (Signed)
 Hospital chart reviewed, including discharge summary Medications reconciled and reviewed with the patient in detail

## 2023-06-27 NOTE — Progress Notes (Signed)
 Established Patient Office Visit  Subjective:  Patient ID: Krista Torres, female    DOB: 2000-05-27  Age: 23 y.o. MRN: [id number]  CC:  Chief Complaint  Patient presents with   Hospitalization Follow-up    HPI Krista Torres is a 23 y.o. female   has a past medical history of Acanthosis nigricans (09/22/2011) and Migraine (02/21/2020). Patient presents for hospitalization follow up for COVID-19 infection.  She was at the emergency department on 06/20/2023 when she was diagnosed with COVID-19.  States that she feels good, currently denies fever, chills, chest pain, shortness of breath, headache.  Cough is better and does not have to take cough medication.  Stated that she is up-to-date with COVID vaccines    Past Medical History:  Diagnosis Date   Acanthosis nigricans 09/22/2011   Migraine 02/21/2020    History reviewed. No pertinent surgical history.  Family History  Problem Relation Age of Onset   Cancer Maternal Grandmother    Diabetes Maternal Grandfather     Social History   Socioeconomic History   Marital status: Single    Spouse name: Not on file   Number of children: Not on file   Years of education: Not on file   Highest education level: Not on file  Occupational History   Not on file  Tobacco Use   Smoking status: Never   Smokeless tobacco: Never  Vaping Use   Vaping status: Never Used  Substance and Sexual Activity   Alcohol use: Yes    Comment: occasional   Drug use: Never   Sexual activity: Yes  Other Topics Concern   Not on file  Social History Narrative   Not on file   Social Drivers of Health   Financial Resource Strain: Not on file  Food Insecurity: No Food Insecurity (06/23/2023)   Hunger Vital Sign    Worried About Running Out of Food in the Last Year: Never true    Ran Out of Food in the Last Year: Never true  Transportation Needs: No Transportation Needs (06/23/2023)   PRAPARE - Scientist, research (physical sciences) (Medical): No    Lack of Transportation (Non-Medical): No  Physical Activity: Not on file  Stress: Not on file  Social Connections: Unknown (09/25/2022)   Received from [facility]   Social Network    Social Network: Not on file  Intimate Partner Violence: Unknown (09/25/2022)   Received from [facility]   HITS    Physically Hurt: Not on file    Insult or Talk Down To: Not on file    Threaten Physical Harm: Not on file    Scream or Curse: Not on file    Outpatient Medications Prior to Visit  Medication Sig Dispense Refill   norethindrone  (MICRONOR ) 0.35 MG tablet Take 1 tablet (0.35 mg total) by mouth daily. (Patient not taking: Reported on 06/27/2023) 28 tablet 11   No facility-administered medications prior to visit.    No Known Allergies  ROS Review of Systems  Constitutional:  Negative for appetite change, chills, fatigue and fever.  HENT:  Negative for congestion, postnasal drip, rhinorrhea and sneezing.   Respiratory:  Positive for cough. Negative for shortness of breath and wheezing.   Cardiovascular:  Negative for chest pain, palpitations and leg swelling.  Gastrointestinal:  Negative for abdominal pain, constipation, nausea and vomiting.  Genitourinary:  Negative for difficulty urinating, dysuria, flank pain and frequency.  Musculoskeletal:  Negative for arthralgias, back pain, joint swelling and  myalgias.  Skin:  Negative for color change, pallor, rash and wound.  Neurological:  Negative for dizziness, facial asymmetry, weakness, numbness and headaches.  Psychiatric/Behavioral:  Negative for behavioral problems, confusion, self-injury and suicidal ideas.       Objective:    Physical Exam Vitals and nursing note reviewed.  Constitutional:      General: She is not in acute distress.    Appearance: Normal appearance. She is obese. She is not ill-appearing, toxic-appearing or diaphoretic.   Eyes:     General: No scleral icterus.       Right  eye: No discharge.        Left eye: No discharge.     Extraocular Movements: Extraocular movements intact.     Conjunctiva/sclera: Conjunctivae normal.    Cardiovascular:     Rate and Rhythm: Normal rate and regular rhythm.     Pulses: Normal pulses.     Heart sounds: Normal heart sounds. No murmur heard.    No friction rub. No gallop.  Pulmonary:     Effort: Pulmonary effort is normal. No respiratory distress.     Breath sounds: Normal breath sounds. No stridor. No wheezing, rhonchi or rales.  Chest:     Chest wall: No tenderness.  Abdominal:     General: There is no distension.     Palpations: Abdomen is soft.     Tenderness: There is no abdominal tenderness. There is no right CVA tenderness, left CVA tenderness or guarding.   Musculoskeletal:        General: No swelling, tenderness, deformity or signs of injury.     Right lower leg: No edema.     Left lower leg: No edema.   Skin:    General: Skin is warm and dry.     Capillary Refill: Capillary refill takes less than 2 seconds.     Coloration: Skin is not jaundiced or pale.     Findings: No bruising, erythema or lesion.   Neurological:     Mental Status: She is alert and oriented to person, place, and time.     Motor: No weakness.     Gait: Gait normal.   Psychiatric:        Mood and Affect: Mood normal.        Behavior: Behavior normal.        Thought Content: Thought content normal.        Judgment: Judgment normal.     BP 131/85   Pulse 99   Temp 97.6 F (36.4 C)   Wt 255 lb (115.7 kg)   SpO2 99%   BMI 36.59 kg/m  Wt Readings from Last 3 Encounters:  06/27/23 255 lb (115.7 kg)  04/22/23 261 lb (118.4 kg)  02/23/23 260 lb 3.2 oz (118 kg)    Lab Results  Component Value Date   TSH 2.080 03/01/2023   Lab Results  Component Value Date   WBC 4.8 03/01/2023   HGB 15.4 03/01/2023   HCT 46.4 03/01/2023   MCV 86 03/01/2023   PLT 270 03/01/2023   Lab Results  Component Value Date   NA 137  03/01/2023   K 4.1 03/01/2023   CO2 23 03/01/2023   GLUCOSE 102 (H) 03/01/2023   BUN 5 (L) 03/01/2023   CREATININE 0.92 03/01/2023   BILITOT 0.5 03/01/2023   ALKPHOS 85 03/01/2023   AST 22 03/01/2023   ALT 26 03/01/2023   PROT 7.3 03/01/2023   ALBUMIN 4.4 03/01/2023   CALCIUM 9.5  03/01/2023   EGFR 90 03/01/2023   Lab Results  Component Value Date   CHOL 184 03/01/2023   Lab Results  Component Value Date   HDL 42 03/01/2023   Lab Results  Component Value Date   LDLCALC 126 (H) 03/01/2023   Lab Results  Component Value Date   TRIG 88 03/01/2023   Lab Results  Component Value Date   CHOLHDL 4.4 03/01/2023   No results found for: HGBA1C    Assessment & Plan:   Problem List Items Addressed This Visit       Other   COVID-19 - Primary   Now resolved       Encounter for examination following treatment at hospital   Hospital chart reviewed, including discharge summary Medications reconciled and reviewed with the patient in detail        No orders of the defined types were placed in this encounter.   Follow-up: Return in about 9 months (around 03/26/2024) for CPE.    Earnest Thalman R Chantia Amalfitano, FNP

## 2023-06-27 NOTE — Assessment & Plan Note (Signed)
 Now resolved.

## 2023-06-27 NOTE — Patient Instructions (Signed)

## 2023-06-29 DIAGNOSIS — F4321 Adjustment disorder with depressed mood: Secondary | ICD-10-CM | POA: Diagnosis not present

## 2023-06-29 DIAGNOSIS — F411 Generalized anxiety disorder: Secondary | ICD-10-CM | POA: Diagnosis not present

## 2023-07-04 ENCOUNTER — Inpatient Hospital Stay: Payer: Self-pay | Admitting: Nurse Practitioner

## 2023-07-06 DIAGNOSIS — F4321 Adjustment disorder with depressed mood: Secondary | ICD-10-CM | POA: Diagnosis not present

## 2023-07-06 DIAGNOSIS — F411 Generalized anxiety disorder: Secondary | ICD-10-CM | POA: Diagnosis not present

## 2023-07-13 DIAGNOSIS — F4321 Adjustment disorder with depressed mood: Secondary | ICD-10-CM | POA: Diagnosis not present

## 2023-07-13 DIAGNOSIS — F411 Generalized anxiety disorder: Secondary | ICD-10-CM | POA: Diagnosis not present

## 2023-07-22 DIAGNOSIS — F4321 Adjustment disorder with depressed mood: Secondary | ICD-10-CM | POA: Diagnosis not present

## 2023-07-22 DIAGNOSIS — F411 Generalized anxiety disorder: Secondary | ICD-10-CM | POA: Diagnosis not present

## 2023-07-27 DIAGNOSIS — F4321 Adjustment disorder with depressed mood: Secondary | ICD-10-CM | POA: Diagnosis not present

## 2023-07-27 DIAGNOSIS — F411 Generalized anxiety disorder: Secondary | ICD-10-CM | POA: Diagnosis not present

## 2023-08-03 DIAGNOSIS — F4321 Adjustment disorder with depressed mood: Secondary | ICD-10-CM | POA: Diagnosis not present

## 2023-08-03 DIAGNOSIS — F411 Generalized anxiety disorder: Secondary | ICD-10-CM | POA: Diagnosis not present

## 2023-08-10 DIAGNOSIS — F4321 Adjustment disorder with depressed mood: Secondary | ICD-10-CM | POA: Diagnosis not present

## 2023-08-10 DIAGNOSIS — F411 Generalized anxiety disorder: Secondary | ICD-10-CM | POA: Diagnosis not present

## 2023-08-19 DIAGNOSIS — F411 Generalized anxiety disorder: Secondary | ICD-10-CM | POA: Diagnosis not present

## 2023-08-19 DIAGNOSIS — F4321 Adjustment disorder with depressed mood: Secondary | ICD-10-CM | POA: Diagnosis not present

## 2023-09-07 DIAGNOSIS — F4321 Adjustment disorder with depressed mood: Secondary | ICD-10-CM | POA: Diagnosis not present

## 2023-09-07 DIAGNOSIS — F411 Generalized anxiety disorder: Secondary | ICD-10-CM | POA: Diagnosis not present

## 2023-11-02 DIAGNOSIS — F4321 Adjustment disorder with depressed mood: Secondary | ICD-10-CM | POA: Diagnosis not present

## 2023-11-02 DIAGNOSIS — F411 Generalized anxiety disorder: Secondary | ICD-10-CM | POA: Diagnosis not present

## 2023-11-25 DIAGNOSIS — F4321 Adjustment disorder with depressed mood: Secondary | ICD-10-CM | POA: Diagnosis not present

## 2023-11-30 DIAGNOSIS — F411 Generalized anxiety disorder: Secondary | ICD-10-CM | POA: Diagnosis not present

## 2023-11-30 DIAGNOSIS — F4321 Adjustment disorder with depressed mood: Secondary | ICD-10-CM | POA: Diagnosis not present

## 2023-12-14 DIAGNOSIS — F411 Generalized anxiety disorder: Secondary | ICD-10-CM | POA: Diagnosis not present

## 2023-12-14 DIAGNOSIS — F4321 Adjustment disorder with depressed mood: Secondary | ICD-10-CM | POA: Diagnosis not present

## 2023-12-21 DIAGNOSIS — F4321 Adjustment disorder with depressed mood: Secondary | ICD-10-CM | POA: Diagnosis not present

## 2023-12-21 DIAGNOSIS — F411 Generalized anxiety disorder: Secondary | ICD-10-CM | POA: Diagnosis not present

## 2023-12-28 DIAGNOSIS — F411 Generalized anxiety disorder: Secondary | ICD-10-CM | POA: Diagnosis not present

## 2023-12-28 DIAGNOSIS — F4321 Adjustment disorder with depressed mood: Secondary | ICD-10-CM | POA: Diagnosis not present

## 2024-03-28 ENCOUNTER — Encounter: Payer: Self-pay | Admitting: Nurse Practitioner
